# Patient Record
Sex: Female | Born: 1960 | Race: White | Hispanic: No | Marital: Single | Smoking: Never smoker
Health system: Southern US, Community
[De-identification: ages and names within clinical notes are randomized; demographics above are authoritative.]

## PROBLEM LIST (undated history)

## (undated) DIAGNOSIS — F840 Autistic disorder: Secondary | ICD-10-CM

## (undated) DIAGNOSIS — S32009A Unspecified fracture of unspecified lumbar vertebra, initial encounter for closed fracture: Secondary | ICD-10-CM

## (undated) DIAGNOSIS — M797 Fibromyalgia: Secondary | ICD-10-CM

## (undated) HISTORY — DX: Fibromyalgia: M79.7

## (undated) HISTORY — PX: EXTERNAL EAR SURGERY: SHX627

---

## 2011-10-24 DIAGNOSIS — J069 Acute upper respiratory infection, unspecified: Secondary | ICD-10-CM | POA: Diagnosis not present

## 2011-10-24 DIAGNOSIS — B9789 Other viral agents as the cause of diseases classified elsewhere: Secondary | ICD-10-CM | POA: Diagnosis not present

## 2011-10-24 DIAGNOSIS — Z79899 Other long term (current) drug therapy: Secondary | ICD-10-CM | POA: Diagnosis not present

## 2011-12-30 DIAGNOSIS — E782 Mixed hyperlipidemia: Secondary | ICD-10-CM | POA: Diagnosis not present

## 2011-12-30 DIAGNOSIS — M545 Low back pain: Secondary | ICD-10-CM | POA: Diagnosis not present

## 2011-12-30 DIAGNOSIS — K219 Gastro-esophageal reflux disease without esophagitis: Secondary | ICD-10-CM | POA: Diagnosis not present

## 2011-12-30 DIAGNOSIS — M549 Dorsalgia, unspecified: Secondary | ICD-10-CM | POA: Diagnosis not present

## 2011-12-30 DIAGNOSIS — F411 Generalized anxiety disorder: Secondary | ICD-10-CM | POA: Diagnosis not present

## 2012-03-31 DIAGNOSIS — E782 Mixed hyperlipidemia: Secondary | ICD-10-CM | POA: Diagnosis not present

## 2012-03-31 DIAGNOSIS — M549 Dorsalgia, unspecified: Secondary | ICD-10-CM | POA: Diagnosis not present

## 2012-06-27 DIAGNOSIS — Z23 Encounter for immunization: Secondary | ICD-10-CM | POA: Diagnosis not present

## 2012-07-17 DIAGNOSIS — M549 Dorsalgia, unspecified: Secondary | ICD-10-CM | POA: Diagnosis not present

## 2012-10-19 DIAGNOSIS — M545 Low back pain: Secondary | ICD-10-CM | POA: Diagnosis not present

## 2012-12-04 DIAGNOSIS — M47817 Spondylosis without myelopathy or radiculopathy, lumbosacral region: Secondary | ICD-10-CM | POA: Diagnosis not present

## 2012-12-04 DIAGNOSIS — M51379 Other intervertebral disc degeneration, lumbosacral region without mention of lumbar back pain or lower extremity pain: Secondary | ICD-10-CM | POA: Diagnosis not present

## 2012-12-04 DIAGNOSIS — M5137 Other intervertebral disc degeneration, lumbosacral region: Secondary | ICD-10-CM | POA: Diagnosis not present

## 2013-01-12 DIAGNOSIS — F410 Panic disorder [episodic paroxysmal anxiety] without agoraphobia: Secondary | ICD-10-CM | POA: Diagnosis not present

## 2013-01-12 DIAGNOSIS — F259 Schizoaffective disorder, unspecified: Secondary | ICD-10-CM | POA: Diagnosis not present

## 2013-01-18 DIAGNOSIS — M549 Dorsalgia, unspecified: Secondary | ICD-10-CM | POA: Diagnosis not present

## 2013-04-19 DIAGNOSIS — M549 Dorsalgia, unspecified: Secondary | ICD-10-CM | POA: Diagnosis not present

## 2013-05-15 DIAGNOSIS — F410 Panic disorder [episodic paroxysmal anxiety] without agoraphobia: Secondary | ICD-10-CM | POA: Diagnosis not present

## 2013-05-15 DIAGNOSIS — F4001 Agoraphobia with panic disorder: Secondary | ICD-10-CM | POA: Diagnosis not present

## 2013-05-15 DIAGNOSIS — F209 Schizophrenia, unspecified: Secondary | ICD-10-CM | POA: Diagnosis not present

## 2013-05-15 DIAGNOSIS — F4542 Pain disorder with related psychological factors: Secondary | ICD-10-CM | POA: Diagnosis not present

## 2013-05-15 DIAGNOSIS — Z79899 Other long term (current) drug therapy: Secondary | ICD-10-CM | POA: Diagnosis not present

## 2013-06-11 DIAGNOSIS — S32009A Unspecified fracture of unspecified lumbar vertebra, initial encounter for closed fracture: Secondary | ICD-10-CM

## 2013-06-11 HISTORY — DX: Unspecified fracture of unspecified lumbar vertebra, initial encounter for closed fracture: S32.009A

## 2013-07-10 DIAGNOSIS — F411 Generalized anxiety disorder: Secondary | ICD-10-CM | POA: Diagnosis not present

## 2013-07-10 DIAGNOSIS — F329 Major depressive disorder, single episode, unspecified: Secondary | ICD-10-CM | POA: Diagnosis not present

## 2013-07-10 DIAGNOSIS — R51 Headache: Secondary | ICD-10-CM | POA: Diagnosis not present

## 2013-07-10 DIAGNOSIS — Z79899 Other long term (current) drug therapy: Secondary | ICD-10-CM | POA: Diagnosis not present

## 2013-07-10 DIAGNOSIS — S0990XA Unspecified injury of head, initial encounter: Secondary | ICD-10-CM | POA: Diagnosis not present

## 2013-07-10 DIAGNOSIS — S40029A Contusion of unspecified upper arm, initial encounter: Secondary | ICD-10-CM | POA: Diagnosis not present

## 2013-07-10 DIAGNOSIS — T148XXA Other injury of unspecified body region, initial encounter: Secondary | ICD-10-CM | POA: Diagnosis not present

## 2013-07-10 DIAGNOSIS — S0993XA Unspecified injury of face, initial encounter: Secondary | ICD-10-CM | POA: Diagnosis not present

## 2013-07-10 DIAGNOSIS — R109 Unspecified abdominal pain: Secondary | ICD-10-CM | POA: Diagnosis not present

## 2013-07-10 DIAGNOSIS — M542 Cervicalgia: Secondary | ICD-10-CM | POA: Diagnosis not present

## 2013-07-10 DIAGNOSIS — F840 Autistic disorder: Secondary | ICD-10-CM | POA: Diagnosis not present

## 2013-07-10 DIAGNOSIS — R52 Pain, unspecified: Secondary | ICD-10-CM | POA: Diagnosis not present

## 2013-07-12 ENCOUNTER — Emergency Department (HOSPITAL_COMMUNITY)
Admission: EM | Admit: 2013-07-12 | Discharge: 2013-07-12 | Disposition: A | Discharge status: Home / Self Care | Payer: No Typology Code available for payment source | Attending: Emergency Medicine | Admitting: Emergency Medicine

## 2013-07-12 ENCOUNTER — Encounter (HOSPITAL_COMMUNITY): Payer: Self-pay | Admitting: *Deleted

## 2013-07-12 ENCOUNTER — Emergency Department (HOSPITAL_COMMUNITY): Payer: No Typology Code available for payment source

## 2013-07-12 DIAGNOSIS — Y9389 Activity, other specified: Secondary | ICD-10-CM | POA: Diagnosis not present

## 2013-07-12 DIAGNOSIS — S0993XA Unspecified injury of face, initial encounter: Secondary | ICD-10-CM | POA: Insufficient documentation

## 2013-07-12 DIAGNOSIS — M542 Cervicalgia: Secondary | ICD-10-CM | POA: Diagnosis not present

## 2013-07-12 DIAGNOSIS — T07XXXA Unspecified multiple injuries, initial encounter: Secondary | ICD-10-CM | POA: Diagnosis not present

## 2013-07-12 DIAGNOSIS — S8990XA Unspecified injury of unspecified lower leg, initial encounter: Secondary | ICD-10-CM | POA: Diagnosis not present

## 2013-07-12 DIAGNOSIS — Y92410 Unspecified street and highway as the place of occurrence of the external cause: Secondary | ICD-10-CM | POA: Insufficient documentation

## 2013-07-12 DIAGNOSIS — S0990XA Unspecified injury of head, initial encounter: Secondary | ICD-10-CM | POA: Diagnosis not present

## 2013-07-12 DIAGNOSIS — R079 Chest pain, unspecified: Secondary | ICD-10-CM | POA: Diagnosis not present

## 2013-07-12 DIAGNOSIS — S300XXA Contusion of lower back and pelvis, initial encounter: Secondary | ICD-10-CM | POA: Diagnosis not present

## 2013-07-12 DIAGNOSIS — S8000XA Contusion of unspecified knee, initial encounter: Secondary | ICD-10-CM | POA: Insufficient documentation

## 2013-07-12 DIAGNOSIS — Z8659 Personal history of other mental and behavioral disorders: Secondary | ICD-10-CM | POA: Insufficient documentation

## 2013-07-12 DIAGNOSIS — S3981XA Other specified injuries of abdomen, initial encounter: Secondary | ICD-10-CM | POA: Insufficient documentation

## 2013-07-12 DIAGNOSIS — M25569 Pain in unspecified knee: Secondary | ICD-10-CM | POA: Diagnosis not present

## 2013-07-12 DIAGNOSIS — S32009A Unspecified fracture of unspecified lumbar vertebra, initial encounter for closed fracture: Secondary | ICD-10-CM | POA: Insufficient documentation

## 2013-07-12 DIAGNOSIS — S4980XA Other specified injuries of shoulder and upper arm, unspecified arm, initial encounter: Secondary | ICD-10-CM | POA: Diagnosis not present

## 2013-07-12 DIAGNOSIS — R0602 Shortness of breath: Secondary | ICD-10-CM | POA: Diagnosis not present

## 2013-07-12 DIAGNOSIS — Z79899 Other long term (current) drug therapy: Secondary | ICD-10-CM | POA: Insufficient documentation

## 2013-07-12 DIAGNOSIS — S46909A Unspecified injury of unspecified muscle, fascia and tendon at shoulder and upper arm level, unspecified arm, initial encounter: Secondary | ICD-10-CM | POA: Diagnosis not present

## 2013-07-12 DIAGNOSIS — S40029A Contusion of unspecified upper arm, initial encounter: Secondary | ICD-10-CM | POA: Insufficient documentation

## 2013-07-12 DIAGNOSIS — R109 Unspecified abdominal pain: Secondary | ICD-10-CM | POA: Diagnosis not present

## 2013-07-12 DIAGNOSIS — S298XXA Other specified injuries of thorax, initial encounter: Secondary | ICD-10-CM | POA: Diagnosis not present

## 2013-07-12 DIAGNOSIS — M79609 Pain in unspecified limb: Secondary | ICD-10-CM | POA: Diagnosis not present

## 2013-07-12 DIAGNOSIS — IMO0002 Reserved for concepts with insufficient information to code with codable children: Secondary | ICD-10-CM | POA: Diagnosis present

## 2013-07-12 HISTORY — DX: Autistic disorder: F84.0

## 2013-07-12 LAB — COMPREHENSIVE METABOLIC PANEL
ALT: 19 U/L (ref 0–35)
AST: 27 U/L (ref 0–37)
Albumin: 3.7 g/dL (ref 3.5–5.2)
BUN: 13 mg/dL (ref 6–23)
CO2: 29 mEq/L (ref 19–32)
Chloride: 98 mEq/L (ref 96–112)
Creatinine, Ser: 0.82 mg/dL (ref 0.50–1.10)
GFR calc Af Amer: >90 mL/min (ref >90)
Glucose, Bld: 104 mg/dL — ABNORMAL HIGH (ref 70–99)
Potassium: 3.8 mEq/L (ref 3.5–5.1)

## 2013-07-12 LAB — CBC WITH DIFFERENTIAL/PLATELET
Basophils Absolute: 0.1 10*3/uL (ref 0.0–0.1)
Basophils Relative: 1 % (ref 0–1)
HCT: 39.4 % (ref 36.0–46.0)
Hemoglobin: 13.7 g/dL (ref 12.0–15.0)
Lymphocytes Relative: 30 % (ref 12–46)
Lymphs Abs: 1.8 10*3/uL (ref 0.7–4.0)
MCH: 31.1 pg (ref 26.0–34.0)
MCHC: 34.8 g/dL (ref 30.0–36.0)
Monocytes Absolute: 0.4 10*3/uL (ref 0.1–1.0)
Monocytes Relative: 7 % (ref 3–12)
Neutro Abs: 3.6 10*3/uL (ref 1.7–7.7)
Neutrophils Relative %: 60 % (ref 43–77)
Platelets: 189 10*3/uL (ref 150–400)
RBC: 4.4 MIL/uL (ref 3.87–5.11)

## 2013-07-12 LAB — URINALYSIS, ROUTINE W REFLEX MICROSCOPIC
Bilirubin Urine: NEGATIVE
Glucose, UA: NEGATIVE mg/dL
Hgb urine dipstick: NEGATIVE
Nitrite: NEGATIVE
Specific Gravity, Urine: 1.01 (ref 1.005–1.030)
pH: 6 (ref 5.0–8.0)

## 2013-07-12 LAB — URINE MICROSCOPIC-ADD ON

## 2013-07-12 MED ORDER — HYDROCODONE-ACETAMINOPHEN 5-325 MG PO TABS
2.0000 | ORAL_TABLET | Freq: Once | ORAL | Status: AC
  Administered 2013-07-12: 2 via ORAL
  Filled 2013-07-12: qty 2

## 2013-07-12 MED ORDER — HYDROCODONE-ACETAMINOPHEN 5-325 MG PO TABS: 2.0000 | ORAL_TABLET | ORAL | Status: DC | PRN

## 2013-07-12 MED ORDER — IOHEXOL 300 MG/ML  SOLN
100.0000 mL | Freq: Once | INTRAMUSCULAR | Status: AC | PRN
  Administered 2013-07-12: 100 mL via INTRAVENOUS
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
## 2013-07-12 NOTE — ED Notes (Signed)
Pt signed consent for [facility] records to be sent over via fax on her behalf.

## 2013-07-12 NOTE — ED Notes (Signed)
ED secretary reported that no records have been recieved at this time.

## 2013-07-20 DIAGNOSIS — Z23 Encounter for immunization: Secondary | ICD-10-CM | POA: Diagnosis not present

## 2013-08-03 DIAGNOSIS — M542 Cervicalgia: Secondary | ICD-10-CM | POA: Diagnosis not present

## 2013-08-03 DIAGNOSIS — M4040 Postural lordosis, site unspecified: Secondary | ICD-10-CM | POA: Diagnosis not present

## 2013-08-03 DIAGNOSIS — M549 Dorsalgia, unspecified: Secondary | ICD-10-CM | POA: Diagnosis not present

## 2013-08-03 DIAGNOSIS — S0993XA Unspecified injury of face, initial encounter: Secondary | ICD-10-CM | POA: Diagnosis not present

## 2013-09-10 DIAGNOSIS — F4310 Post-traumatic stress disorder, unspecified: Secondary | ICD-10-CM | POA: Diagnosis not present

## 2013-09-12 ENCOUNTER — Encounter (HOSPITAL_COMMUNITY): Payer: Self-pay | Admitting: Emergency Medicine

## 2013-09-12 ENCOUNTER — Emergency Department (HOSPITAL_COMMUNITY)
Admission: EM | Admit: 2013-09-12 | Discharge: 2013-09-12 | Disposition: A | Discharge status: Home / Self Care | Payer: No Typology Code available for payment source | Attending: Emergency Medicine | Admitting: Emergency Medicine

## 2013-09-12 DIAGNOSIS — Z8659 Personal history of other mental and behavioral disorders: Secondary | ICD-10-CM | POA: Insufficient documentation

## 2013-09-12 DIAGNOSIS — Z79899 Other long term (current) drug therapy: Secondary | ICD-10-CM | POA: Diagnosis not present

## 2013-09-12 DIAGNOSIS — Z8781 Personal history of (healed) traumatic fracture: Secondary | ICD-10-CM | POA: Insufficient documentation

## 2013-09-12 DIAGNOSIS — M549 Dorsalgia, unspecified: Secondary | ICD-10-CM

## 2013-09-12 DIAGNOSIS — M545 Low back pain, unspecified: Secondary | ICD-10-CM | POA: Insufficient documentation

## 2013-09-12 DIAGNOSIS — R51 Headache: Secondary | ICD-10-CM | POA: Insufficient documentation

## 2013-09-12 DIAGNOSIS — G8911 Acute pain due to trauma: Secondary | ICD-10-CM | POA: Insufficient documentation

## 2013-09-12 HISTORY — DX: Unspecified fracture of unspecified lumbar vertebra, initial encounter for closed fracture: S32.009A
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
## 2013-09-12 NOTE — ED Notes (Signed)
Pt seen and evaluated by EDPa for initial assessment. 
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
## 2013-09-14 NOTE — ED Provider Notes (Signed)
Medical screening examination/treatment/procedure(s) were performed by non-physician practitioner and as supervising physician I was immediately available for consultation/collaboration.  EKG Interpretation   None         Shelda Jakes, MD 09/14/13 1640

## 2013-11-16 DIAGNOSIS — M79609 Pain in unspecified limb: Secondary | ICD-10-CM | POA: Diagnosis not present

## 2013-11-16 DIAGNOSIS — M25519 Pain in unspecified shoulder: Secondary | ICD-10-CM | POA: Diagnosis not present

## 2014-01-03 DIAGNOSIS — E782 Mixed hyperlipidemia: Secondary | ICD-10-CM | POA: Diagnosis not present

## 2014-01-03 DIAGNOSIS — Z0000 Encounter for general adult medical examination without abnormal findings: Secondary | ICD-10-CM | POA: Diagnosis not present

## 2014-01-08 DIAGNOSIS — F411 Generalized anxiety disorder: Secondary | ICD-10-CM | POA: Diagnosis not present

## 2014-01-08 DIAGNOSIS — F259 Schizoaffective disorder, unspecified: Secondary | ICD-10-CM | POA: Diagnosis not present

## 2014-03-21 DIAGNOSIS — M549 Dorsalgia, unspecified: Secondary | ICD-10-CM | POA: Diagnosis not present

## 2014-05-21 DIAGNOSIS — M549 Dorsalgia, unspecified: Secondary | ICD-10-CM | POA: Diagnosis not present

## 2014-07-18 DIAGNOSIS — Z23 Encounter for immunization: Secondary | ICD-10-CM | POA: Diagnosis not present

## 2014-07-23 DIAGNOSIS — M545 Low back pain: Secondary | ICD-10-CM | POA: Diagnosis not present

## 2014-07-23 DIAGNOSIS — Z131 Encounter for screening for diabetes mellitus: Secondary | ICD-10-CM | POA: Diagnosis not present

## 2014-07-23 DIAGNOSIS — Z136 Encounter for screening for cardiovascular disorders: Secondary | ICD-10-CM | POA: Diagnosis not present

## 2014-07-30 DIAGNOSIS — F4310 Post-traumatic stress disorder, unspecified: Secondary | ICD-10-CM | POA: Diagnosis not present

## 2014-07-30 DIAGNOSIS — Z79899 Other long term (current) drug therapy: Secondary | ICD-10-CM | POA: Diagnosis not present

## 2014-07-30 DIAGNOSIS — F259 Schizoaffective disorder, unspecified: Secondary | ICD-10-CM | POA: Diagnosis not present

## 2014-10-24 DIAGNOSIS — J018 Other acute sinusitis: Secondary | ICD-10-CM | POA: Diagnosis not present

## 2014-10-28 DIAGNOSIS — F419 Anxiety disorder, unspecified: Secondary | ICD-10-CM | POA: Diagnosis not present

## 2014-10-28 DIAGNOSIS — F259 Schizoaffective disorder, unspecified: Secondary | ICD-10-CM | POA: Diagnosis not present
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
## 2015-01-22 DIAGNOSIS — E785 Hyperlipidemia, unspecified: Secondary | ICD-10-CM | POA: Diagnosis not present

## 2015-01-22 DIAGNOSIS — Z131 Encounter for screening for diabetes mellitus: Secondary | ICD-10-CM | POA: Diagnosis not present

## 2015-01-22 DIAGNOSIS — K219 Gastro-esophageal reflux disease without esophagitis: Secondary | ICD-10-CM | POA: Diagnosis not present

## 2015-01-22 DIAGNOSIS — Z1389 Encounter for screening for other disorder: Secondary | ICD-10-CM | POA: Diagnosis not present

## 2015-01-22 DIAGNOSIS — Z0000 Encounter for general adult medical examination without abnormal findings: Secondary | ICD-10-CM | POA: Diagnosis not present

## 2015-01-30 DIAGNOSIS — Z1231 Encounter for screening mammogram for malignant neoplasm of breast: Secondary | ICD-10-CM | POA: Diagnosis not present

## 2015-02-25 DIAGNOSIS — Z79899 Other long term (current) drug therapy: Secondary | ICD-10-CM | POA: Diagnosis not present

## 2015-02-25 DIAGNOSIS — F411 Generalized anxiety disorder: Secondary | ICD-10-CM | POA: Diagnosis not present

## 2015-02-25 DIAGNOSIS — F259 Schizoaffective disorder, unspecified: Secondary | ICD-10-CM | POA: Diagnosis not present

## 2015-03-24 DIAGNOSIS — M545 Low back pain: Secondary | ICD-10-CM | POA: Diagnosis not present

## 2015-06-19 DIAGNOSIS — K219 Gastro-esophageal reflux disease without esophagitis: Secondary | ICD-10-CM | POA: Diagnosis not present

## 2015-06-19 DIAGNOSIS — Z1211 Encounter for screening for malignant neoplasm of colon: Secondary | ICD-10-CM | POA: Diagnosis not present

## 2015-06-19 DIAGNOSIS — F209 Schizophrenia, unspecified: Secondary | ICD-10-CM | POA: Diagnosis not present

## 2015-06-19 DIAGNOSIS — E780 Pure hypercholesterolemia: Secondary | ICD-10-CM | POA: Diagnosis not present

## 2015-06-19 DIAGNOSIS — Z79899 Other long term (current) drug therapy: Secondary | ICD-10-CM | POA: Diagnosis not present

## 2015-06-19 DIAGNOSIS — Q438 Other specified congenital malformations of intestine: Secondary | ICD-10-CM | POA: Diagnosis not present

## 2015-06-23 DIAGNOSIS — K210 Gastro-esophageal reflux disease with esophagitis: Secondary | ICD-10-CM | POA: Diagnosis not present

## 2015-06-23 DIAGNOSIS — Z23 Encounter for immunization: Secondary | ICD-10-CM | POA: Diagnosis not present

## 2015-06-23 DIAGNOSIS — E785 Hyperlipidemia, unspecified: Secondary | ICD-10-CM | POA: Diagnosis not present

## 2015-06-27 DIAGNOSIS — F410 Panic disorder [episodic paroxysmal anxiety] without agoraphobia: Secondary | ICD-10-CM | POA: Diagnosis not present

## 2015-06-27 DIAGNOSIS — F250 Schizoaffective disorder, bipolar type: Secondary | ICD-10-CM | POA: Diagnosis not present

## 2015-07-04 DIAGNOSIS — F410 Panic disorder [episodic paroxysmal anxiety] without agoraphobia: Secondary | ICD-10-CM | POA: Diagnosis not present

## 2015-09-22 DIAGNOSIS — M62830 Muscle spasm of back: Secondary | ICD-10-CM | POA: Diagnosis not present

## 2015-09-22 DIAGNOSIS — K210 Gastro-esophageal reflux disease with esophagitis: Secondary | ICD-10-CM | POA: Diagnosis not present

## 2015-12-22 DIAGNOSIS — Z13220 Encounter for screening for lipoid disorders: Secondary | ICD-10-CM | POA: Diagnosis not present

## 2015-12-22 DIAGNOSIS — M62830 Muscle spasm of back: Secondary | ICD-10-CM | POA: Diagnosis not present

## 2015-12-22 DIAGNOSIS — K210 Gastro-esophageal reflux disease with esophagitis: Secondary | ICD-10-CM | POA: Diagnosis not present

## 2016-02-04 DIAGNOSIS — Z5181 Encounter for therapeutic drug level monitoring: Secondary | ICD-10-CM | POA: Diagnosis not present

## 2016-02-04 DIAGNOSIS — M545 Low back pain: Secondary | ICD-10-CM | POA: Diagnosis not present

## 2016-02-20 DIAGNOSIS — F330 Major depressive disorder, recurrent, mild: Secondary | ICD-10-CM | POA: Diagnosis not present

## 2016-02-20 DIAGNOSIS — Z79899 Other long term (current) drug therapy: Secondary | ICD-10-CM | POA: Diagnosis not present

## 2016-03-01 DIAGNOSIS — Z79899 Other long term (current) drug therapy: Secondary | ICD-10-CM | POA: Diagnosis not present

## 2016-03-01 DIAGNOSIS — F330 Major depressive disorder, recurrent, mild: Secondary | ICD-10-CM | POA: Diagnosis not present

## 2016-03-23 DIAGNOSIS — M25511 Pain in right shoulder: Secondary | ICD-10-CM | POA: Diagnosis not present

## 2016-03-23 DIAGNOSIS — Z0000 Encounter for general adult medical examination without abnormal findings: Secondary | ICD-10-CM | POA: Diagnosis not present

## 2016-03-23 DIAGNOSIS — K219 Gastro-esophageal reflux disease without esophagitis: Secondary | ICD-10-CM | POA: Diagnosis not present

## 2016-03-23 DIAGNOSIS — M545 Low back pain: Secondary | ICD-10-CM | POA: Diagnosis not present

## 2016-03-23 DIAGNOSIS — Z1389 Encounter for screening for other disorder: Secondary | ICD-10-CM | POA: Diagnosis not present

## 2016-04-29 DIAGNOSIS — Z6826 Body mass index (BMI) 26.0-26.9, adult: Secondary | ICD-10-CM | POA: Diagnosis not present

## 2016-04-29 DIAGNOSIS — Z01419 Encounter for gynecological examination (general) (routine) without abnormal findings: Secondary | ICD-10-CM | POA: Diagnosis not present

## 2016-05-25 DIAGNOSIS — Z79899 Other long term (current) drug therapy: Secondary | ICD-10-CM | POA: Diagnosis not present

## 2016-05-25 DIAGNOSIS — F250 Schizoaffective disorder, bipolar type: Secondary | ICD-10-CM | POA: Diagnosis not present

## 2016-06-21 DIAGNOSIS — K219 Gastro-esophageal reflux disease without esophagitis: Secondary | ICD-10-CM | POA: Diagnosis not present

## 2016-06-21 DIAGNOSIS — M62830 Muscle spasm of back: Secondary | ICD-10-CM | POA: Diagnosis not present

## 2016-06-28 DIAGNOSIS — M545 Low back pain: Secondary | ICD-10-CM | POA: Diagnosis not present

## 2016-07-01 DIAGNOSIS — M545 Low back pain: Secondary | ICD-10-CM | POA: Diagnosis not present

## 2016-07-05 DIAGNOSIS — M545 Low back pain: Secondary | ICD-10-CM | POA: Diagnosis not present

## 2016-07-08 DIAGNOSIS — Z23 Encounter for immunization: Secondary | ICD-10-CM | POA: Diagnosis not present

## 2016-07-08 DIAGNOSIS — M545 Low back pain: Secondary | ICD-10-CM | POA: Diagnosis not present

## 2016-07-15 DIAGNOSIS — M545 Low back pain: Secondary | ICD-10-CM | POA: Diagnosis not present

## 2016-07-19 DIAGNOSIS — M545 Low back pain: Secondary | ICD-10-CM | POA: Diagnosis not present

## 2016-07-22 DIAGNOSIS — M545 Low back pain: Secondary | ICD-10-CM | POA: Diagnosis not present

## 2016-07-26 DIAGNOSIS — M545 Low back pain: Secondary | ICD-10-CM | POA: Diagnosis not present

## 2016-07-29 DIAGNOSIS — M47816 Spondylosis without myelopathy or radiculopathy, lumbar region: Secondary | ICD-10-CM | POA: Diagnosis not present

## 2016-07-29 DIAGNOSIS — G8929 Other chronic pain: Secondary | ICD-10-CM | POA: Diagnosis not present

## 2016-07-29 DIAGNOSIS — Z79899 Other long term (current) drug therapy: Secondary | ICD-10-CM | POA: Diagnosis not present

## 2016-08-12 DIAGNOSIS — F79 Unspecified intellectual disabilities: Secondary | ICD-10-CM | POA: Diagnosis not present

## 2016-08-12 DIAGNOSIS — M47816 Spondylosis without myelopathy or radiculopathy, lumbar region: Secondary | ICD-10-CM | POA: Diagnosis not present

## 2016-08-12 DIAGNOSIS — G8929 Other chronic pain: Secondary | ICD-10-CM | POA: Diagnosis not present

## 2016-08-17 DIAGNOSIS — Z79899 Other long term (current) drug therapy: Secondary | ICD-10-CM | POA: Diagnosis not present

## 2016-08-17 DIAGNOSIS — F250 Schizoaffective disorder, bipolar type: Secondary | ICD-10-CM | POA: Diagnosis not present

## 2016-08-23 DIAGNOSIS — Z79899 Other long term (current) drug therapy: Secondary | ICD-10-CM | POA: Diagnosis not present

## 2016-08-23 DIAGNOSIS — E782 Mixed hyperlipidemia: Secondary | ICD-10-CM | POA: Diagnosis not present

## 2016-08-23 DIAGNOSIS — K219 Gastro-esophageal reflux disease without esophagitis: Secondary | ICD-10-CM | POA: Diagnosis not present

## 2016-08-23 DIAGNOSIS — E784 Other hyperlipidemia: Secondary | ICD-10-CM | POA: Diagnosis not present

## 2016-09-21 DIAGNOSIS — M47816 Spondylosis without myelopathy or radiculopathy, lumbar region: Secondary | ICD-10-CM | POA: Diagnosis not present

## 2016-09-21 DIAGNOSIS — Z79891 Long term (current) use of opiate analgesic: Secondary | ICD-10-CM | POA: Diagnosis not present

## 2016-09-21 DIAGNOSIS — G8929 Other chronic pain: Secondary | ICD-10-CM | POA: Diagnosis not present

## 2016-09-21 DIAGNOSIS — M5136 Other intervertebral disc degeneration, lumbar region: Secondary | ICD-10-CM | POA: Diagnosis not present

## 2016-10-20 DIAGNOSIS — M47816 Spondylosis without myelopathy or radiculopathy, lumbar region: Secondary | ICD-10-CM | POA: Diagnosis not present

## 2016-10-20 DIAGNOSIS — Z79891 Long term (current) use of opiate analgesic: Secondary | ICD-10-CM | POA: Diagnosis not present

## 2016-10-20 DIAGNOSIS — G8929 Other chronic pain: Secondary | ICD-10-CM | POA: Diagnosis not present

## 2016-10-20 DIAGNOSIS — M5136 Other intervertebral disc degeneration, lumbar region: Secondary | ICD-10-CM | POA: Diagnosis not present

## 2016-11-08 DIAGNOSIS — E784 Other hyperlipidemia: Secondary | ICD-10-CM | POA: Diagnosis not present

## 2016-11-08 DIAGNOSIS — K219 Gastro-esophageal reflux disease without esophagitis: Secondary | ICD-10-CM | POA: Diagnosis not present

## 2016-11-19 DIAGNOSIS — F411 Generalized anxiety disorder: Secondary | ICD-10-CM | POA: Diagnosis not present

## 2016-12-14 DIAGNOSIS — Z79891 Long term (current) use of opiate analgesic: Secondary | ICD-10-CM | POA: Diagnosis not present

## 2017-01-06 DIAGNOSIS — K219 Gastro-esophageal reflux disease without esophagitis: Secondary | ICD-10-CM | POA: Diagnosis not present

## 2017-01-06 DIAGNOSIS — Z79899 Other long term (current) drug therapy: Secondary | ICD-10-CM | POA: Diagnosis not present

## 2017-01-06 DIAGNOSIS — E784 Other hyperlipidemia: Secondary | ICD-10-CM | POA: Diagnosis not present

## 2017-01-12 DIAGNOSIS — F79 Unspecified intellectual disabilities: Secondary | ICD-10-CM | POA: Diagnosis not present

## 2017-01-12 DIAGNOSIS — M47816 Spondylosis without myelopathy or radiculopathy, lumbar region: Secondary | ICD-10-CM | POA: Diagnosis not present

## 2017-01-12 DIAGNOSIS — G8929 Other chronic pain: Secondary | ICD-10-CM | POA: Diagnosis not present

## 2017-03-09 DIAGNOSIS — Z79899 Other long term (current) drug therapy: Secondary | ICD-10-CM | POA: Diagnosis not present

## 2017-03-09 DIAGNOSIS — F250 Schizoaffective disorder, bipolar type: Secondary | ICD-10-CM | POA: Diagnosis not present

## 2017-03-11 DIAGNOSIS — F330 Major depressive disorder, recurrent, mild: Secondary | ICD-10-CM | POA: Diagnosis not present

## 2017-03-11 DIAGNOSIS — F410 Panic disorder [episodic paroxysmal anxiety] without agoraphobia: Secondary | ICD-10-CM | POA: Diagnosis not present

## 2017-03-11 DIAGNOSIS — Z79899 Other long term (current) drug therapy: Secondary | ICD-10-CM | POA: Diagnosis not present

## 2017-04-08 DIAGNOSIS — E784 Other hyperlipidemia: Secondary | ICD-10-CM | POA: Diagnosis not present

## 2017-04-08 DIAGNOSIS — K219 Gastro-esophageal reflux disease without esophagitis: Secondary | ICD-10-CM | POA: Diagnosis not present

## 2017-05-11 DIAGNOSIS — G8929 Other chronic pain: Secondary | ICD-10-CM | POA: Diagnosis not present

## 2017-05-11 DIAGNOSIS — Z79899 Other long term (current) drug therapy: Secondary | ICD-10-CM | POA: Diagnosis not present

## 2017-05-11 DIAGNOSIS — M545 Low back pain: Secondary | ICD-10-CM | POA: Diagnosis not present

## 2017-05-18 DIAGNOSIS — Z79899 Other long term (current) drug therapy: Secondary | ICD-10-CM | POA: Diagnosis not present

## 2017-05-18 DIAGNOSIS — F410 Panic disorder [episodic paroxysmal anxiety] without agoraphobia: Secondary | ICD-10-CM | POA: Diagnosis not present

## 2017-06-14 DIAGNOSIS — M545 Low back pain: Secondary | ICD-10-CM | POA: Diagnosis not present

## 2017-06-14 DIAGNOSIS — Z79899 Other long term (current) drug therapy: Secondary | ICD-10-CM | POA: Diagnosis not present

## 2017-06-14 DIAGNOSIS — G8929 Other chronic pain: Secondary | ICD-10-CM | POA: Diagnosis not present

## 2017-06-14 DIAGNOSIS — M5136 Other intervertebral disc degeneration, lumbar region: Secondary | ICD-10-CM | POA: Diagnosis not present

## 2017-06-14 DIAGNOSIS — M797 Fibromyalgia: Secondary | ICD-10-CM | POA: Diagnosis not present

## 2017-07-12 DIAGNOSIS — M5136 Other intervertebral disc degeneration, lumbar region: Secondary | ICD-10-CM | POA: Diagnosis not present

## 2017-07-12 DIAGNOSIS — M797 Fibromyalgia: Secondary | ICD-10-CM | POA: Diagnosis not present

## 2017-07-12 DIAGNOSIS — Z79899 Other long term (current) drug therapy: Secondary | ICD-10-CM | POA: Diagnosis not present

## 2017-07-13 DIAGNOSIS — Z23 Encounter for immunization: Secondary | ICD-10-CM | POA: Diagnosis not present

## 2017-07-14 DIAGNOSIS — K219 Gastro-esophageal reflux disease without esophagitis: Secondary | ICD-10-CM | POA: Diagnosis not present

## 2017-07-14 DIAGNOSIS — E782 Mixed hyperlipidemia: Secondary | ICD-10-CM | POA: Diagnosis not present

## 2017-07-14 DIAGNOSIS — E7849 Other hyperlipidemia: Secondary | ICD-10-CM | POA: Diagnosis not present

## 2017-07-29 DIAGNOSIS — Z79899 Other long term (current) drug therapy: Secondary | ICD-10-CM | POA: Diagnosis not present

## 2017-07-29 DIAGNOSIS — F250 Schizoaffective disorder, bipolar type: Secondary | ICD-10-CM | POA: Diagnosis not present

## 2017-08-11 DIAGNOSIS — Z79899 Other long term (current) drug therapy: Secondary | ICD-10-CM | POA: Diagnosis not present

## 2017-08-11 DIAGNOSIS — M5136 Other intervertebral disc degeneration, lumbar region: Secondary | ICD-10-CM | POA: Diagnosis not present

## 2017-08-11 DIAGNOSIS — M797 Fibromyalgia: Secondary | ICD-10-CM | POA: Diagnosis not present

## 2017-08-19 DIAGNOSIS — F411 Generalized anxiety disorder: Secondary | ICD-10-CM | POA: Diagnosis not present

## 2017-08-19 DIAGNOSIS — Z79899 Other long term (current) drug therapy: Secondary | ICD-10-CM | POA: Diagnosis not present

## 2017-09-06 DIAGNOSIS — F411 Generalized anxiety disorder: Secondary | ICD-10-CM | POA: Diagnosis not present

## 2017-09-06 DIAGNOSIS — Z79899 Other long term (current) drug therapy: Secondary | ICD-10-CM | POA: Diagnosis not present

## 2017-09-12 DIAGNOSIS — M5136 Other intervertebral disc degeneration, lumbar region: Secondary | ICD-10-CM | POA: Diagnosis not present

## 2017-09-12 DIAGNOSIS — M545 Low back pain: Secondary | ICD-10-CM | POA: Diagnosis not present

## 2017-09-12 DIAGNOSIS — Z79899 Other long term (current) drug therapy: Secondary | ICD-10-CM | POA: Diagnosis not present

## 2017-09-15 DIAGNOSIS — F411 Generalized anxiety disorder: Secondary | ICD-10-CM | POA: Diagnosis not present

## 2017-09-15 DIAGNOSIS — Z79899 Other long term (current) drug therapy: Secondary | ICD-10-CM | POA: Diagnosis not present

## 2017-09-29 DIAGNOSIS — F200 Paranoid schizophrenia: Secondary | ICD-10-CM | POA: Diagnosis not present

## 2017-09-29 DIAGNOSIS — F411 Generalized anxiety disorder: Secondary | ICD-10-CM | POA: Diagnosis not present

## 2017-09-29 DIAGNOSIS — Z79899 Other long term (current) drug therapy: Secondary | ICD-10-CM | POA: Diagnosis not present

## 2017-10-06 DIAGNOSIS — M797 Fibromyalgia: Secondary | ICD-10-CM | POA: Diagnosis not present

## 2017-10-06 DIAGNOSIS — Z79899 Other long term (current) drug therapy: Secondary | ICD-10-CM | POA: Diagnosis not present

## 2017-10-06 DIAGNOSIS — M5136 Other intervertebral disc degeneration, lumbar region: Secondary | ICD-10-CM | POA: Diagnosis not present

## 2017-10-10 DIAGNOSIS — Z79899 Other long term (current) drug therapy: Secondary | ICD-10-CM | POA: Diagnosis not present

## 2017-10-10 DIAGNOSIS — F411 Generalized anxiety disorder: Secondary | ICD-10-CM | POA: Diagnosis not present

## 2017-10-13 DIAGNOSIS — M5136 Other intervertebral disc degeneration, lumbar region: Secondary | ICD-10-CM | POA: Diagnosis not present

## 2017-10-13 DIAGNOSIS — M47816 Spondylosis without myelopathy or radiculopathy, lumbar region: Secondary | ICD-10-CM | POA: Diagnosis not present

## 2017-10-15 DIAGNOSIS — Z79899 Other long term (current) drug therapy: Secondary | ICD-10-CM | POA: Diagnosis not present

## 2017-10-15 DIAGNOSIS — F411 Generalized anxiety disorder: Secondary | ICD-10-CM | POA: Diagnosis not present

## 2017-10-17 DIAGNOSIS — E782 Mixed hyperlipidemia: Secondary | ICD-10-CM | POA: Diagnosis not present

## 2017-10-17 DIAGNOSIS — K219 Gastro-esophageal reflux disease without esophagitis: Secondary | ICD-10-CM | POA: Diagnosis not present

## 2017-10-17 DIAGNOSIS — Z1389 Encounter for screening for other disorder: Secondary | ICD-10-CM | POA: Diagnosis not present

## 2017-10-17 DIAGNOSIS — Z0000 Encounter for general adult medical examination without abnormal findings: Secondary | ICD-10-CM | POA: Diagnosis not present

## 2017-11-08 DIAGNOSIS — M797 Fibromyalgia: Secondary | ICD-10-CM | POA: Diagnosis not present

## 2017-11-08 DIAGNOSIS — Z79899 Other long term (current) drug therapy: Secondary | ICD-10-CM | POA: Diagnosis not present

## 2017-11-08 DIAGNOSIS — M5136 Other intervertebral disc degeneration, lumbar region: Secondary | ICD-10-CM | POA: Diagnosis not present

## 2017-12-06 DIAGNOSIS — Z79899 Other long term (current) drug therapy: Secondary | ICD-10-CM | POA: Diagnosis not present

## 2017-12-06 DIAGNOSIS — M797 Fibromyalgia: Secondary | ICD-10-CM | POA: Diagnosis not present

## 2017-12-06 DIAGNOSIS — M5136 Other intervertebral disc degeneration, lumbar region: Secondary | ICD-10-CM | POA: Diagnosis not present

## 2018-01-03 DIAGNOSIS — M5136 Other intervertebral disc degeneration, lumbar region: Secondary | ICD-10-CM | POA: Diagnosis not present

## 2018-01-03 DIAGNOSIS — Z79899 Other long term (current) drug therapy: Secondary | ICD-10-CM | POA: Diagnosis not present

## 2018-01-03 DIAGNOSIS — M797 Fibromyalgia: Secondary | ICD-10-CM | POA: Diagnosis not present

## 2018-01-16 DIAGNOSIS — K219 Gastro-esophageal reflux disease without esophagitis: Secondary | ICD-10-CM | POA: Diagnosis not present

## 2018-01-16 DIAGNOSIS — E782 Mixed hyperlipidemia: Secondary | ICD-10-CM | POA: Diagnosis not present

## 2018-02-01 DIAGNOSIS — M797 Fibromyalgia: Secondary | ICD-10-CM | POA: Diagnosis not present

## 2018-02-01 DIAGNOSIS — Z79899 Other long term (current) drug therapy: Secondary | ICD-10-CM | POA: Diagnosis not present

## 2018-02-01 DIAGNOSIS — M5136 Other intervertebral disc degeneration, lumbar region: Secondary | ICD-10-CM | POA: Diagnosis not present

## 2018-03-02 DIAGNOSIS — M5136 Other intervertebral disc degeneration, lumbar region: Secondary | ICD-10-CM | POA: Diagnosis not present

## 2018-03-02 DIAGNOSIS — Z79899 Other long term (current) drug therapy: Secondary | ICD-10-CM | POA: Diagnosis not present

## 2018-03-02 DIAGNOSIS — M797 Fibromyalgia: Secondary | ICD-10-CM | POA: Diagnosis not present

## 2018-03-02 DIAGNOSIS — G8929 Other chronic pain: Secondary | ICD-10-CM | POA: Diagnosis not present

## 2018-03-02 DIAGNOSIS — M545 Low back pain: Secondary | ICD-10-CM | POA: Diagnosis not present

## 2018-03-28 DIAGNOSIS — Z79899 Other long term (current) drug therapy: Secondary | ICD-10-CM | POA: Diagnosis not present

## 2018-03-28 DIAGNOSIS — F410 Panic disorder [episodic paroxysmal anxiety] without agoraphobia: Secondary | ICD-10-CM | POA: Diagnosis not present

## 2018-03-31 DIAGNOSIS — M797 Fibromyalgia: Secondary | ICD-10-CM | POA: Diagnosis not present

## 2018-03-31 DIAGNOSIS — M5136 Other intervertebral disc degeneration, lumbar region: Secondary | ICD-10-CM | POA: Diagnosis not present

## 2018-03-31 DIAGNOSIS — Z79899 Other long term (current) drug therapy: Secondary | ICD-10-CM | POA: Diagnosis not present

## 2018-04-05 DIAGNOSIS — F04 Amnestic disorder due to known physiological condition: Secondary | ICD-10-CM | POA: Diagnosis not present

## 2018-04-07 DIAGNOSIS — F04 Amnestic disorder due to known physiological condition: Secondary | ICD-10-CM | POA: Diagnosis not present

## 2018-04-07 DIAGNOSIS — Z79899 Other long term (current) drug therapy: Secondary | ICD-10-CM | POA: Diagnosis not present

## 2018-04-20 DIAGNOSIS — K210 Gastro-esophageal reflux disease with esophagitis: Secondary | ICD-10-CM | POA: Diagnosis not present

## 2018-04-20 DIAGNOSIS — E782 Mixed hyperlipidemia: Secondary | ICD-10-CM | POA: Diagnosis not present

## 2018-05-10 DIAGNOSIS — M797 Fibromyalgia: Secondary | ICD-10-CM | POA: Diagnosis not present

## 2018-05-10 DIAGNOSIS — M5136 Other intervertebral disc degeneration, lumbar region: Secondary | ICD-10-CM | POA: Diagnosis not present

## 2018-05-10 DIAGNOSIS — Z6824 Body mass index (BMI) 24.0-24.9, adult: Secondary | ICD-10-CM | POA: Diagnosis not present

## 2018-05-10 DIAGNOSIS — Z79899 Other long term (current) drug therapy: Secondary | ICD-10-CM | POA: Diagnosis not present

## 2018-06-07 DIAGNOSIS — Z79899 Other long term (current) drug therapy: Secondary | ICD-10-CM | POA: Diagnosis not present

## 2018-06-07 DIAGNOSIS — M5136 Other intervertebral disc degeneration, lumbar region: Secondary | ICD-10-CM | POA: Diagnosis not present

## 2018-06-07 DIAGNOSIS — M797 Fibromyalgia: Secondary | ICD-10-CM | POA: Diagnosis not present

## 2018-06-20 DIAGNOSIS — Z79899 Other long term (current) drug therapy: Secondary | ICD-10-CM | POA: Diagnosis not present

## 2018-06-20 DIAGNOSIS — F411 Generalized anxiety disorder: Secondary | ICD-10-CM | POA: Diagnosis not present

## 2018-07-11 DIAGNOSIS — M5442 Lumbago with sciatica, left side: Secondary | ICD-10-CM | POA: Diagnosis not present

## 2018-07-11 DIAGNOSIS — Z79899 Other long term (current) drug therapy: Secondary | ICD-10-CM | POA: Diagnosis not present

## 2018-07-11 DIAGNOSIS — M797 Fibromyalgia: Secondary | ICD-10-CM | POA: Diagnosis not present

## 2018-07-11 DIAGNOSIS — M5136 Other intervertebral disc degeneration, lumbar region: Secondary | ICD-10-CM | POA: Diagnosis not present

## 2018-07-11 DIAGNOSIS — M5441 Lumbago with sciatica, right side: Secondary | ICD-10-CM | POA: Diagnosis not present

## 2018-07-17 DIAGNOSIS — F250 Schizoaffective disorder, bipolar type: Secondary | ICD-10-CM | POA: Diagnosis not present

## 2018-07-17 DIAGNOSIS — Z79899 Other long term (current) drug therapy: Secondary | ICD-10-CM | POA: Diagnosis not present

## 2018-07-18 DIAGNOSIS — K210 Gastro-esophageal reflux disease with esophagitis: Secondary | ICD-10-CM | POA: Diagnosis not present

## 2018-07-18 DIAGNOSIS — E782 Mixed hyperlipidemia: Secondary | ICD-10-CM | POA: Diagnosis not present

## 2018-08-03 DIAGNOSIS — Z23 Encounter for immunization: Secondary | ICD-10-CM | POA: Diagnosis not present

## 2018-08-10 DIAGNOSIS — Z79899 Other long term (current) drug therapy: Secondary | ICD-10-CM | POA: Diagnosis not present

## 2018-08-10 DIAGNOSIS — M5136 Other intervertebral disc degeneration, lumbar region: Secondary | ICD-10-CM | POA: Diagnosis not present

## 2018-08-10 DIAGNOSIS — Z6824 Body mass index (BMI) 24.0-24.9, adult: Secondary | ICD-10-CM | POA: Diagnosis not present

## 2018-08-10 DIAGNOSIS — M797 Fibromyalgia: Secondary | ICD-10-CM | POA: Diagnosis not present

## 2018-08-30 DIAGNOSIS — Z79899 Other long term (current) drug therapy: Secondary | ICD-10-CM | POA: Diagnosis not present

## 2018-08-30 DIAGNOSIS — F410 Panic disorder [episodic paroxysmal anxiety] without agoraphobia: Secondary | ICD-10-CM | POA: Diagnosis not present

## 2018-09-05 DIAGNOSIS — Z79899 Other long term (current) drug therapy: Secondary | ICD-10-CM | POA: Diagnosis not present

## 2018-09-05 DIAGNOSIS — M797 Fibromyalgia: Secondary | ICD-10-CM | POA: Diagnosis not present

## 2018-09-05 DIAGNOSIS — M5136 Other intervertebral disc degeneration, lumbar region: Secondary | ICD-10-CM | POA: Diagnosis not present

## 2018-09-20 DIAGNOSIS — Z79899 Other long term (current) drug therapy: Secondary | ICD-10-CM | POA: Diagnosis not present

## 2018-09-20 DIAGNOSIS — F410 Panic disorder [episodic paroxysmal anxiety] without agoraphobia: Secondary | ICD-10-CM | POA: Diagnosis not present

## 2018-09-26 DIAGNOSIS — M797 Fibromyalgia: Secondary | ICD-10-CM | POA: Diagnosis not present

## 2018-09-26 DIAGNOSIS — M5136 Other intervertebral disc degeneration, lumbar region: Secondary | ICD-10-CM | POA: Diagnosis not present

## 2018-09-26 DIAGNOSIS — Z79899 Other long term (current) drug therapy: Secondary | ICD-10-CM | POA: Diagnosis not present

## 2018-09-27 DIAGNOSIS — F410 Panic disorder [episodic paroxysmal anxiety] without agoraphobia: Secondary | ICD-10-CM | POA: Diagnosis not present

## 2018-09-27 DIAGNOSIS — Z79899 Other long term (current) drug therapy: Secondary | ICD-10-CM | POA: Diagnosis not present

## 2018-10-09 DIAGNOSIS — Z79899 Other long term (current) drug therapy: Secondary | ICD-10-CM | POA: Diagnosis not present

## 2018-10-09 DIAGNOSIS — F410 Panic disorder [episodic paroxysmal anxiety] without agoraphobia: Secondary | ICD-10-CM | POA: Diagnosis not present

## 2018-10-18 DIAGNOSIS — Z0000 Encounter for general adult medical examination without abnormal findings: Secondary | ICD-10-CM | POA: Diagnosis not present

## 2018-10-18 DIAGNOSIS — E782 Mixed hyperlipidemia: Secondary | ICD-10-CM | POA: Diagnosis not present

## 2018-10-18 DIAGNOSIS — Z1389 Encounter for screening for other disorder: Secondary | ICD-10-CM | POA: Diagnosis not present

## 2018-10-18 DIAGNOSIS — Z131 Encounter for screening for diabetes mellitus: Secondary | ICD-10-CM | POA: Diagnosis not present

## 2018-10-18 DIAGNOSIS — K210 Gastro-esophageal reflux disease with esophagitis: Secondary | ICD-10-CM | POA: Diagnosis not present

## 2018-10-24 DIAGNOSIS — M797 Fibromyalgia: Secondary | ICD-10-CM | POA: Diagnosis not present

## 2018-10-24 DIAGNOSIS — Z79899 Other long term (current) drug therapy: Secondary | ICD-10-CM | POA: Diagnosis not present

## 2018-10-24 DIAGNOSIS — M5136 Other intervertebral disc degeneration, lumbar region: Secondary | ICD-10-CM | POA: Diagnosis not present

## 2018-10-26 DIAGNOSIS — F410 Panic disorder [episodic paroxysmal anxiety] without agoraphobia: Secondary | ICD-10-CM | POA: Diagnosis not present

## 2018-10-26 DIAGNOSIS — Z79899 Other long term (current) drug therapy: Secondary | ICD-10-CM | POA: Diagnosis not present

## 2018-11-06 DIAGNOSIS — Z1231 Encounter for screening mammogram for malignant neoplasm of breast: Secondary | ICD-10-CM | POA: Diagnosis not present

## 2018-11-30 DIAGNOSIS — M5136 Other intervertebral disc degeneration, lumbar region: Secondary | ICD-10-CM | POA: Diagnosis not present

## 2018-11-30 DIAGNOSIS — Z79899 Other long term (current) drug therapy: Secondary | ICD-10-CM | POA: Diagnosis not present

## 2018-11-30 DIAGNOSIS — M797 Fibromyalgia: Secondary | ICD-10-CM | POA: Diagnosis not present

## 2018-12-29 DIAGNOSIS — M797 Fibromyalgia: Secondary | ICD-10-CM | POA: Diagnosis not present

## 2018-12-29 DIAGNOSIS — Z6825 Body mass index (BMI) 25.0-25.9, adult: Secondary | ICD-10-CM | POA: Diagnosis not present

## 2018-12-29 DIAGNOSIS — Z79899 Other long term (current) drug therapy: Secondary | ICD-10-CM | POA: Diagnosis not present

## 2018-12-29 DIAGNOSIS — M5136 Other intervertebral disc degeneration, lumbar region: Secondary | ICD-10-CM | POA: Diagnosis not present

## 2019-01-17 DIAGNOSIS — E782 Mixed hyperlipidemia: Secondary | ICD-10-CM | POA: Diagnosis not present

## 2019-01-17 DIAGNOSIS — K210 Gastro-esophageal reflux disease with esophagitis: Secondary | ICD-10-CM | POA: Diagnosis not present

## 2019-01-29 DIAGNOSIS — M797 Fibromyalgia: Secondary | ICD-10-CM | POA: Diagnosis not present

## 2019-01-29 DIAGNOSIS — Z79899 Other long term (current) drug therapy: Secondary | ICD-10-CM | POA: Diagnosis not present

## 2019-01-29 DIAGNOSIS — M5136 Other intervertebral disc degeneration, lumbar region: Secondary | ICD-10-CM | POA: Diagnosis not present

## 2019-03-02 DIAGNOSIS — Z1159 Encounter for screening for other viral diseases: Secondary | ICD-10-CM | POA: Diagnosis not present

## 2019-03-02 DIAGNOSIS — M797 Fibromyalgia: Secondary | ICD-10-CM | POA: Diagnosis not present

## 2019-03-02 DIAGNOSIS — M5136 Other intervertebral disc degeneration, lumbar region: Secondary | ICD-10-CM | POA: Diagnosis not present

## 2019-03-02 DIAGNOSIS — Z79899 Other long term (current) drug therapy: Secondary | ICD-10-CM | POA: Diagnosis not present

## 2019-03-30 DIAGNOSIS — Z79899 Other long term (current) drug therapy: Secondary | ICD-10-CM | POA: Diagnosis not present

## 2019-03-30 DIAGNOSIS — M5136 Other intervertebral disc degeneration, lumbar region: Secondary | ICD-10-CM | POA: Diagnosis not present

## 2019-03-30 DIAGNOSIS — M797 Fibromyalgia: Secondary | ICD-10-CM | POA: Diagnosis not present

## 2019-04-02 DIAGNOSIS — F410 Panic disorder [episodic paroxysmal anxiety] without agoraphobia: Secondary | ICD-10-CM | POA: Diagnosis not present

## 2019-04-12 DIAGNOSIS — M545 Low back pain: Secondary | ICD-10-CM | POA: Diagnosis not present

## 2019-04-12 DIAGNOSIS — E782 Mixed hyperlipidemia: Secondary | ICD-10-CM | POA: Diagnosis not present

## 2019-04-12 DIAGNOSIS — K210 Gastro-esophageal reflux disease with esophagitis: Secondary | ICD-10-CM | POA: Diagnosis not present

## 2019-04-26 DIAGNOSIS — M5136 Other intervertebral disc degeneration, lumbar region: Secondary | ICD-10-CM | POA: Diagnosis not present

## 2019-04-26 DIAGNOSIS — Z6825 Body mass index (BMI) 25.0-25.9, adult: Secondary | ICD-10-CM | POA: Diagnosis not present

## 2019-04-26 DIAGNOSIS — Z79899 Other long term (current) drug therapy: Secondary | ICD-10-CM | POA: Diagnosis not present

## 2019-04-26 DIAGNOSIS — M797 Fibromyalgia: Secondary | ICD-10-CM | POA: Diagnosis not present

## 2019-05-24 DIAGNOSIS — M5136 Other intervertebral disc degeneration, lumbar region: Secondary | ICD-10-CM | POA: Diagnosis not present

## 2019-05-24 DIAGNOSIS — Z79899 Other long term (current) drug therapy: Secondary | ICD-10-CM | POA: Diagnosis not present

## 2019-05-24 DIAGNOSIS — M797 Fibromyalgia: Secondary | ICD-10-CM | POA: Diagnosis not present

## 2019-05-24 DIAGNOSIS — Z6826 Body mass index (BMI) 26.0-26.9, adult: Secondary | ICD-10-CM | POA: Diagnosis not present

## 2019-06-28 DIAGNOSIS — M797 Fibromyalgia: Secondary | ICD-10-CM | POA: Diagnosis not present

## 2019-06-28 DIAGNOSIS — M5136 Other intervertebral disc degeneration, lumbar region: Secondary | ICD-10-CM | POA: Diagnosis not present

## 2019-06-28 DIAGNOSIS — Z79899 Other long term (current) drug therapy: Secondary | ICD-10-CM | POA: Diagnosis not present

## 2019-06-29 DIAGNOSIS — Z23 Encounter for immunization: Secondary | ICD-10-CM | POA: Diagnosis not present

## 2019-07-17 DIAGNOSIS — M545 Low back pain: Secondary | ICD-10-CM | POA: Diagnosis not present

## 2019-07-17 DIAGNOSIS — Z131 Encounter for screening for diabetes mellitus: Secondary | ICD-10-CM | POA: Diagnosis not present

## 2019-07-17 DIAGNOSIS — E782 Mixed hyperlipidemia: Secondary | ICD-10-CM | POA: Diagnosis not present

## 2019-07-17 DIAGNOSIS — K219 Gastro-esophageal reflux disease without esophagitis: Secondary | ICD-10-CM | POA: Diagnosis not present

## 2019-07-26 DIAGNOSIS — Z79899 Other long term (current) drug therapy: Secondary | ICD-10-CM | POA: Diagnosis not present

## 2019-07-26 DIAGNOSIS — M797 Fibromyalgia: Secondary | ICD-10-CM | POA: Diagnosis not present

## 2019-07-26 DIAGNOSIS — M5136 Other intervertebral disc degeneration, lumbar region: Secondary | ICD-10-CM | POA: Diagnosis not present

## 2019-09-04 DIAGNOSIS — M5136 Other intervertebral disc degeneration, lumbar region: Secondary | ICD-10-CM | POA: Diagnosis not present

## 2019-09-04 DIAGNOSIS — M797 Fibromyalgia: Secondary | ICD-10-CM | POA: Diagnosis not present

## 2019-09-04 DIAGNOSIS — Z1159 Encounter for screening for other viral diseases: Secondary | ICD-10-CM | POA: Diagnosis not present

## 2019-09-04 DIAGNOSIS — Z79899 Other long term (current) drug therapy: Secondary | ICD-10-CM | POA: Diagnosis not present

## 2019-09-28 DIAGNOSIS — M5136 Other intervertebral disc degeneration, lumbar region: Secondary | ICD-10-CM | POA: Diagnosis not present

## 2019-09-28 DIAGNOSIS — Z79899 Other long term (current) drug therapy: Secondary | ICD-10-CM | POA: Diagnosis not present

## 2019-09-28 DIAGNOSIS — M797 Fibromyalgia: Secondary | ICD-10-CM | POA: Diagnosis not present

## 2019-09-28 DIAGNOSIS — Z1159 Encounter for screening for other viral diseases: Secondary | ICD-10-CM | POA: Diagnosis not present

## 2019-10-24 DIAGNOSIS — Z0000 Encounter for general adult medical examination without abnormal findings: Secondary | ICD-10-CM | POA: Diagnosis not present

## 2019-10-24 DIAGNOSIS — E782 Mixed hyperlipidemia: Secondary | ICD-10-CM | POA: Diagnosis not present

## 2019-10-24 DIAGNOSIS — M545 Low back pain: Secondary | ICD-10-CM | POA: Diagnosis not present

## 2019-10-24 DIAGNOSIS — K2100 Gastro-esophageal reflux disease with esophagitis, without bleeding: Secondary | ICD-10-CM | POA: Diagnosis not present

## 2019-10-24 DIAGNOSIS — Z1389 Encounter for screening for other disorder: Secondary | ICD-10-CM | POA: Diagnosis not present

## 2020-01-30 DIAGNOSIS — M545 Low back pain: Secondary | ICD-10-CM | POA: Diagnosis not present

## 2020-01-30 DIAGNOSIS — Z1389 Encounter for screening for other disorder: Secondary | ICD-10-CM | POA: Diagnosis not present

## 2020-01-30 DIAGNOSIS — Z131 Encounter for screening for diabetes mellitus: Secondary | ICD-10-CM | POA: Diagnosis not present

## 2020-01-30 DIAGNOSIS — K2100 Gastro-esophageal reflux disease with esophagitis, without bleeding: Secondary | ICD-10-CM | POA: Diagnosis not present

## 2020-01-30 DIAGNOSIS — E782 Mixed hyperlipidemia: Secondary | ICD-10-CM | POA: Diagnosis not present

## 2020-01-30 DIAGNOSIS — Z0000 Encounter for general adult medical examination without abnormal findings: Secondary | ICD-10-CM | POA: Diagnosis not present

## 2020-02-12 DIAGNOSIS — Z124 Encounter for screening for malignant neoplasm of cervix: Secondary | ICD-10-CM | POA: Diagnosis not present

## 2020-02-12 DIAGNOSIS — N951 Menopausal and female climacteric states: Secondary | ICD-10-CM | POA: Diagnosis not present

## 2020-02-12 DIAGNOSIS — Z1239 Encounter for other screening for malignant neoplasm of breast: Secondary | ICD-10-CM | POA: Diagnosis not present

## 2020-02-12 DIAGNOSIS — Z01411 Encounter for gynecological examination (general) (routine) with abnormal findings: Secondary | ICD-10-CM | POA: Diagnosis not present

## 2020-02-27 DIAGNOSIS — R102 Pelvic and perineal pain: Secondary | ICD-10-CM | POA: Diagnosis not present

## 2020-03-14 DIAGNOSIS — R102 Pelvic and perineal pain: Secondary | ICD-10-CM | POA: Diagnosis not present

## 2020-03-14 DIAGNOSIS — R800 Isolated proteinuria: Secondary | ICD-10-CM | POA: Diagnosis not present

## 2020-03-14 DIAGNOSIS — N94810 Vulvar vestibulitis: Secondary | ICD-10-CM | POA: Diagnosis not present

## 2020-03-14 DIAGNOSIS — R8290 Unspecified abnormal findings in urine: Secondary | ICD-10-CM | POA: Diagnosis not present

## 2020-05-06 DIAGNOSIS — E782 Mixed hyperlipidemia: Secondary | ICD-10-CM | POA: Diagnosis not present

## 2020-05-06 DIAGNOSIS — R102 Pelvic and perineal pain: Secondary | ICD-10-CM | POA: Diagnosis not present

## 2020-05-06 DIAGNOSIS — K2100 Gastro-esophageal reflux disease with esophagitis, without bleeding: Secondary | ICD-10-CM | POA: Diagnosis not present

## 2020-05-06 DIAGNOSIS — M545 Low back pain: Secondary | ICD-10-CM | POA: Diagnosis not present

## 2020-05-20 DIAGNOSIS — Z1231 Encounter for screening mammogram for malignant neoplasm of breast: Secondary | ICD-10-CM | POA: Diagnosis not present

## 2020-05-23 ENCOUNTER — Encounter: Payer: Self-pay | Admitting: Adult Health

## 2020-05-23 ENCOUNTER — Other Ambulatory Visit (HOSPITAL_COMMUNITY)
Admission: RE | Admit: 2020-05-23 | Discharge: 2020-05-23 | Disposition: A | Discharge status: Home / Self Care | Payer: Medicare Other | Source: Ambulatory Visit | Attending: Adult Health | Admitting: Adult Health

## 2020-05-23 ENCOUNTER — Ambulatory Visit (INDEPENDENT_AMBULATORY_CARE_PROVIDER_SITE_OTHER): Payer: Medicare Other | Admitting: Adult Health

## 2020-05-23 VITALS — BP 122/73 | HR 83 | Ht 65.0 in | Wt 156.0 lb

## 2020-05-23 DIAGNOSIS — R102 Pelvic and perineal pain: Secondary | ICD-10-CM | POA: Diagnosis not present

## 2020-05-23 DIAGNOSIS — Z1211 Encounter for screening for malignant neoplasm of colon: Secondary | ICD-10-CM | POA: Diagnosis not present

## 2020-05-23 DIAGNOSIS — F79 Unspecified intellectual disabilities: Secondary | ICD-10-CM | POA: Diagnosis not present

## 2020-05-23 DIAGNOSIS — Z01419 Encounter for gynecological examination (general) (routine) without abnormal findings: Secondary | ICD-10-CM

## 2020-05-23 DIAGNOSIS — Z1151 Encounter for screening for human papillomavirus (HPV): Secondary | ICD-10-CM | POA: Insufficient documentation

## 2020-05-23 LAB — HEMOCCULT GUIAC POC 1CARD (OFFICE): Fecal Occult Blood, POC: NEGATIVE

## 2020-05-23 NOTE — Addendum Note (Signed)
Addended by: Annamarie Dawley on: 05/23/2020 11:35 AM   Modules accepted: Orders

## 2020-05-23 NOTE — Progress Notes (Addendum)
  Subjective:     Patient ID: Robyn Keith, female   DOB: [birth year], 59 y.o.   MRN: [id number]  HPI Robyn Keith is a 59 year old white female, single, G0P0, in for pap and pelvic and has vaginal pain at times. Robyn Keith has mental disability and her sister is with her.  She said has been hurt when having pap in the past. Had CT at [facility] that was normal. PCP is Dr Olena Leatherwood.   Review of Systems Pain in vaginal at times, can be sharp No vaginal bleeding Has never had sex Reviewed past medical,surgical, social and family history. Reviewed medications and allergies.     Objective:   Physical Exam BP 122/73 (BP Location: Left Arm, Patient Position: Sitting, Cuff Size: Normal)   Pulse 83   Ht 5\' 5"  (1.651 m)   Wt 156 lb (70.8 kg)   BMI 25.96 kg/m  Skin warm and dry.Pelvic: external genitalia is normal in appearance no lesions, vagina:pale with loss of moisture and rugae,urethra has no lesions or masses noted, cervix:smooth, and poorly visualized, pap with high risk HPV genotyping performed,, uterus: normal size, shape and contour, non tender, no masses felt, adnexa: no masses or tenderness noted. Bladder is non tender and no masses felt.On rectal rexam has good tone, no masses and hemoccult is negative. Used pediatric speculum.And she tolerated well.  AA is 0 Fall risk is low PHQ 9 score is 12, no SI, is on meds Examination chaperoned by LPN    Assessment:     1. Encounter for gynecological examination with Papanicolaou smear of cervix Pap sent Pap in 3 if normal Physical with PCP  2. Encounter for screening fecal occult blood testing  3. Mental disability  4. Vaginal pain Try replens     Plan:     Pap in 3 years if normal

## 2020-05-23 NOTE — Addendum Note (Signed)
Addended by: Cyril Mourning A on: 05/23/2020 11:27 AM   Modules accepted: Orders

## 2020-05-28 LAB — CYTOLOGY - PAP
Comment: NEGATIVE
Diagnosis: NEGATIVE
High risk HPV: NEGATIVE

## 2020-08-07 DIAGNOSIS — Z131 Encounter for screening for diabetes mellitus: Secondary | ICD-10-CM | POA: Diagnosis not present

## 2020-08-07 DIAGNOSIS — K2100 Gastro-esophageal reflux disease with esophagitis, without bleeding: Secondary | ICD-10-CM | POA: Diagnosis not present

## 2020-08-07 DIAGNOSIS — R102 Pelvic and perineal pain: Secondary | ICD-10-CM | POA: Diagnosis not present

## 2020-08-07 DIAGNOSIS — E782 Mixed hyperlipidemia: Secondary | ICD-10-CM | POA: Diagnosis not present

## 2020-08-07 DIAGNOSIS — M545 Low back pain, unspecified: Secondary | ICD-10-CM | POA: Diagnosis not present

## 2020-11-05 DIAGNOSIS — Z23 Encounter for immunization: Secondary | ICD-10-CM | POA: Diagnosis not present

## 2020-11-10 DIAGNOSIS — M545 Low back pain, unspecified: Secondary | ICD-10-CM | POA: Diagnosis not present

## 2020-11-10 DIAGNOSIS — K2100 Gastro-esophageal reflux disease with esophagitis, without bleeding: Secondary | ICD-10-CM | POA: Diagnosis not present

## 2020-11-10 DIAGNOSIS — E782 Mixed hyperlipidemia: Secondary | ICD-10-CM | POA: Diagnosis not present

## 2020-11-10 DIAGNOSIS — R102 Pelvic and perineal pain: Secondary | ICD-10-CM | POA: Diagnosis not present

## 2021-01-07 DIAGNOSIS — K2100 Gastro-esophageal reflux disease with esophagitis, without bleeding: Secondary | ICD-10-CM | POA: Diagnosis not present

## 2021-01-07 DIAGNOSIS — G459 Transient cerebral ischemic attack, unspecified: Secondary | ICD-10-CM | POA: Diagnosis not present

## 2021-01-07 DIAGNOSIS — E782 Mixed hyperlipidemia: Secondary | ICD-10-CM | POA: Diagnosis not present

## 2021-01-07 DIAGNOSIS — R102 Pelvic and perineal pain: Secondary | ICD-10-CM | POA: Diagnosis not present

## 2021-01-07 DIAGNOSIS — M545 Low back pain, unspecified: Secondary | ICD-10-CM | POA: Diagnosis not present

## 2021-01-15 DIAGNOSIS — I6782 Cerebral ischemia: Secondary | ICD-10-CM | POA: Diagnosis not present

## 2021-01-15 DIAGNOSIS — G459 Transient cerebral ischemic attack, unspecified: Secondary | ICD-10-CM | POA: Diagnosis not present

## 2021-01-15 DIAGNOSIS — J3489 Other specified disorders of nose and nasal sinuses: Secondary | ICD-10-CM | POA: Diagnosis not present

## 2021-04-01 DIAGNOSIS — R102 Pelvic and perineal pain: Secondary | ICD-10-CM | POA: Diagnosis not present

## 2021-04-01 DIAGNOSIS — M545 Low back pain, unspecified: Secondary | ICD-10-CM | POA: Diagnosis not present

## 2021-04-01 DIAGNOSIS — K2100 Gastro-esophageal reflux disease with esophagitis, without bleeding: Secondary | ICD-10-CM | POA: Diagnosis not present

## 2021-04-01 DIAGNOSIS — Z1331 Encounter for screening for depression: Secondary | ICD-10-CM | POA: Diagnosis not present

## 2021-04-01 DIAGNOSIS — E782 Mixed hyperlipidemia: Secondary | ICD-10-CM | POA: Diagnosis not present

## 2021-04-01 DIAGNOSIS — Z0000 Encounter for general adult medical examination without abnormal findings: Secondary | ICD-10-CM | POA: Diagnosis not present

## 2021-04-01 DIAGNOSIS — G459 Transient cerebral ischemic attack, unspecified: Secondary | ICD-10-CM | POA: Diagnosis not present

## 2021-04-01 DIAGNOSIS — Z131 Encounter for screening for diabetes mellitus: Secondary | ICD-10-CM | POA: Diagnosis not present

## 2021-05-28 DIAGNOSIS — M79603 Pain in arm, unspecified: Secondary | ICD-10-CM | POA: Diagnosis not present

## 2021-05-28 DIAGNOSIS — E782 Mixed hyperlipidemia: Secondary | ICD-10-CM | POA: Diagnosis not present

## 2021-05-28 DIAGNOSIS — R102 Pelvic and perineal pain: Secondary | ICD-10-CM | POA: Diagnosis not present

## 2021-05-28 DIAGNOSIS — G459 Transient cerebral ischemic attack, unspecified: Secondary | ICD-10-CM | POA: Diagnosis not present

## 2021-05-28 DIAGNOSIS — M545 Low back pain, unspecified: Secondary | ICD-10-CM | POA: Diagnosis not present

## 2021-05-28 DIAGNOSIS — K2100 Gastro-esophageal reflux disease with esophagitis, without bleeding: Secondary | ICD-10-CM | POA: Diagnosis not present

## 2021-07-24 DIAGNOSIS — Z23 Encounter for immunization: Secondary | ICD-10-CM | POA: Diagnosis not present

## 2021-07-24 DIAGNOSIS — Z20828 Contact with and (suspected) exposure to other viral communicable diseases: Secondary | ICD-10-CM | POA: Diagnosis not present

## 2021-08-13 ENCOUNTER — Ambulatory Visit: Payer: Medicare Other | Admitting: Neurology

## 2021-08-31 DIAGNOSIS — Z20828 Contact with and (suspected) exposure to other viral communicable diseases: Secondary | ICD-10-CM | POA: Diagnosis not present

## 2021-09-08 DIAGNOSIS — K2100 Gastro-esophageal reflux disease with esophagitis, without bleeding: Secondary | ICD-10-CM | POA: Diagnosis not present

## 2021-09-08 DIAGNOSIS — Z131 Encounter for screening for diabetes mellitus: Secondary | ICD-10-CM | POA: Diagnosis not present

## 2021-09-08 DIAGNOSIS — E782 Mixed hyperlipidemia: Secondary | ICD-10-CM | POA: Diagnosis not present

## 2021-09-08 DIAGNOSIS — L309 Dermatitis, unspecified: Secondary | ICD-10-CM | POA: Diagnosis not present

## 2021-09-08 DIAGNOSIS — M545 Low back pain, unspecified: Secondary | ICD-10-CM | POA: Diagnosis not present

## 2021-09-08 DIAGNOSIS — F259 Schizoaffective disorder, unspecified: Secondary | ICD-10-CM | POA: Diagnosis not present

## 2021-09-30 DIAGNOSIS — Z20828 Contact with and (suspected) exposure to other viral communicable diseases: Secondary | ICD-10-CM | POA: Diagnosis not present

## 2021-12-09 DIAGNOSIS — F411 Generalized anxiety disorder: Secondary | ICD-10-CM | POA: Diagnosis not present

## 2021-12-09 DIAGNOSIS — E782 Mixed hyperlipidemia: Secondary | ICD-10-CM | POA: Diagnosis not present

## 2021-12-09 DIAGNOSIS — F259 Schizoaffective disorder, unspecified: Secondary | ICD-10-CM | POA: Diagnosis not present

## 2021-12-09 DIAGNOSIS — M545 Low back pain, unspecified: Secondary | ICD-10-CM | POA: Diagnosis not present

## 2021-12-09 DIAGNOSIS — K2100 Gastro-esophageal reflux disease with esophagitis, without bleeding: Secondary | ICD-10-CM | POA: Diagnosis not present

## 2022-03-16 DIAGNOSIS — F411 Generalized anxiety disorder: Secondary | ICD-10-CM | POA: Diagnosis not present

## 2022-03-16 DIAGNOSIS — F259 Schizoaffective disorder, unspecified: Secondary | ICD-10-CM | POA: Diagnosis not present

## 2022-03-16 DIAGNOSIS — K2100 Gastro-esophageal reflux disease with esophagitis, without bleeding: Secondary | ICD-10-CM | POA: Diagnosis not present

## 2022-03-16 DIAGNOSIS — E782 Mixed hyperlipidemia: Secondary | ICD-10-CM | POA: Diagnosis not present

## 2022-03-16 DIAGNOSIS — M545 Low back pain, unspecified: Secondary | ICD-10-CM | POA: Diagnosis not present

## 2022-06-22 DIAGNOSIS — F259 Schizoaffective disorder, unspecified: Secondary | ICD-10-CM | POA: Diagnosis not present

## 2022-06-22 DIAGNOSIS — F411 Generalized anxiety disorder: Secondary | ICD-10-CM | POA: Diagnosis not present

## 2022-06-22 DIAGNOSIS — E782 Mixed hyperlipidemia: Secondary | ICD-10-CM | POA: Diagnosis not present

## 2022-06-22 DIAGNOSIS — M545 Low back pain, unspecified: Secondary | ICD-10-CM | POA: Diagnosis not present

## 2022-06-22 DIAGNOSIS — Z0000 Encounter for general adult medical examination without abnormal findings: Secondary | ICD-10-CM | POA: Diagnosis not present

## 2022-06-22 DIAGNOSIS — Z1331 Encounter for screening for depression: Secondary | ICD-10-CM | POA: Diagnosis not present

## 2022-06-22 DIAGNOSIS — K2100 Gastro-esophageal reflux disease with esophagitis, without bleeding: Secondary | ICD-10-CM | POA: Diagnosis not present

## 2022-07-10 DIAGNOSIS — Z23 Encounter for immunization: Secondary | ICD-10-CM | POA: Diagnosis not present

## 2022-09-29 DIAGNOSIS — F259 Schizoaffective disorder, unspecified: Secondary | ICD-10-CM | POA: Diagnosis not present

## 2022-09-29 DIAGNOSIS — F411 Generalized anxiety disorder: Secondary | ICD-10-CM | POA: Diagnosis not present

## 2022-09-29 DIAGNOSIS — M545 Low back pain, unspecified: Secondary | ICD-10-CM | POA: Diagnosis not present

## 2022-09-29 DIAGNOSIS — K2100 Gastro-esophageal reflux disease with esophagitis, without bleeding: Secondary | ICD-10-CM | POA: Diagnosis not present

## 2022-09-29 DIAGNOSIS — E782 Mixed hyperlipidemia: Secondary | ICD-10-CM | POA: Diagnosis not present

## 2022-12-29 DIAGNOSIS — F259 Schizoaffective disorder, unspecified: Secondary | ICD-10-CM | POA: Diagnosis not present

## 2022-12-29 DIAGNOSIS — K2100 Gastro-esophageal reflux disease with esophagitis, without bleeding: Secondary | ICD-10-CM | POA: Diagnosis not present

## 2022-12-29 DIAGNOSIS — E782 Mixed hyperlipidemia: Secondary | ICD-10-CM | POA: Diagnosis not present

## 2022-12-29 DIAGNOSIS — F411 Generalized anxiety disorder: Secondary | ICD-10-CM | POA: Diagnosis not present

## 2022-12-29 DIAGNOSIS — M545 Low back pain, unspecified: Secondary | ICD-10-CM | POA: Diagnosis not present

## 2023-01-10 DIAGNOSIS — Z1231 Encounter for screening mammogram for malignant neoplasm of breast: Secondary | ICD-10-CM | POA: Diagnosis not present

## 2023-03-28 DIAGNOSIS — M5459 Other low back pain: Secondary | ICD-10-CM | POA: Diagnosis not present

## 2023-03-28 DIAGNOSIS — F411 Generalized anxiety disorder: Secondary | ICD-10-CM | POA: Diagnosis not present

## 2023-03-28 DIAGNOSIS — E782 Mixed hyperlipidemia: Secondary | ICD-10-CM | POA: Diagnosis not present

## 2023-03-28 DIAGNOSIS — M545 Low back pain, unspecified: Secondary | ICD-10-CM | POA: Diagnosis not present

## 2023-03-28 DIAGNOSIS — K2100 Gastro-esophageal reflux disease with esophagitis, without bleeding: Secondary | ICD-10-CM | POA: Diagnosis not present

## 2023-03-28 DIAGNOSIS — F259 Schizoaffective disorder, unspecified: Secondary | ICD-10-CM | POA: Diagnosis not present

## 2023-06-27 DIAGNOSIS — K2100 Gastro-esophageal reflux disease with esophagitis, without bleeding: Secondary | ICD-10-CM | POA: Diagnosis not present

## 2023-06-27 DIAGNOSIS — E782 Mixed hyperlipidemia: Secondary | ICD-10-CM | POA: Diagnosis not present

## 2023-06-27 DIAGNOSIS — Z0000 Encounter for general adult medical examination without abnormal findings: Secondary | ICD-10-CM | POA: Diagnosis not present

## 2023-06-27 DIAGNOSIS — M5459 Other low back pain: Secondary | ICD-10-CM | POA: Diagnosis not present

## 2023-06-27 DIAGNOSIS — F411 Generalized anxiety disorder: Secondary | ICD-10-CM | POA: Diagnosis not present

## 2023-06-27 DIAGNOSIS — Z1331 Encounter for screening for depression: Secondary | ICD-10-CM | POA: Diagnosis not present

## 2023-06-27 DIAGNOSIS — F259 Schizoaffective disorder, unspecified: Secondary | ICD-10-CM | POA: Diagnosis not present

## 2023-08-10 ENCOUNTER — Ambulatory Visit: Payer: Medicare Other | Admitting: Adult Health

## 2023-09-21 ENCOUNTER — Other Ambulatory Visit (HOSPITAL_COMMUNITY)
Admission: RE | Admit: 2023-09-21 | Discharge: 2023-09-21 | Disposition: A | Discharge status: Home / Self Care | Payer: Medicare Other | Source: Ambulatory Visit | Attending: Adult Health | Admitting: Adult Health

## 2023-09-21 ENCOUNTER — Encounter: Payer: Self-pay | Admitting: Adult Health

## 2023-09-21 ENCOUNTER — Ambulatory Visit: Payer: Medicare Other | Admitting: Adult Health

## 2023-09-21 VITALS — BP 115/74 | HR 89 | Ht 65.0 in | Wt 147.0 lb

## 2023-09-21 DIAGNOSIS — Z0000 Encounter for general adult medical examination without abnormal findings: Secondary | ICD-10-CM

## 2023-09-21 DIAGNOSIS — Z01419 Encounter for gynecological examination (general) (routine) without abnormal findings: Secondary | ICD-10-CM

## 2023-09-21 DIAGNOSIS — Z1331 Encounter for screening for depression: Secondary | ICD-10-CM | POA: Diagnosis not present

## 2023-09-21 DIAGNOSIS — Z1151 Encounter for screening for human papillomavirus (HPV): Secondary | ICD-10-CM | POA: Diagnosis not present

## 2023-09-21 DIAGNOSIS — F79 Unspecified intellectual disabilities: Secondary | ICD-10-CM | POA: Diagnosis not present

## 2023-09-21 DIAGNOSIS — Z1211 Encounter for screening for malignant neoplasm of colon: Secondary | ICD-10-CM

## 2023-09-21 DIAGNOSIS — Z124 Encounter for screening for malignant neoplasm of cervix: Secondary | ICD-10-CM

## 2023-09-21 LAB — HEMOCCULT GUIAC POC 1CARD (OFFICE): Fecal Occult Blood, POC: NEGATIVE

## 2023-09-21 NOTE — Addendum Note (Signed)
Addended by: Freddie Apley R on: 09/21/2023 11:12 AM   Modules accepted: Orders

## 2023-09-21 NOTE — Progress Notes (Signed)
Patient ID: Robyn Keith, female   DOB: June 15, 1961, 62 y.o.   MRN: [id number] History of Present Illness: Robyn Keith is a 62 year old white female,single, PM in for a gyn exam and pap. She had her physical with PCP 06/27/23. Her sister is with her, Robyn Keith has mental disability.   PCP is Dr Olena Leatherwood   Current Medications, Allergies, Past Medical History, Past Surgical History, Family History and Social History were reviewed in [employer] electronic medical record.     Review of Systems: Patient denies any problems with bowel movements, urination(does not pee often per sister), or intercourse(has never had sex). No joint pain or mood swings.  Has body aches at times    Physical Exam:BP 115/74 (BP Location: Left Arm, Patient Position: Sitting, Cuff Size: Normal)   Pulse 89   Ht 5\' 5"  (1.651 m)   Wt 147 lb (66.7 kg)   BMI 24.46 kg/m   General:  Well developed, well nourished, no acute distress Skin:  Warm and dry Lungs; Clear to auscultation bilaterally Breast:  No dominant palpable mass, retraction, or nipple discharge Cardiovascular: Regular rate and rhythm Pelvic:  External genitalia is normal in appearance, no lesions.  The vagina is pale, using small slender Pedersen speculum. Urethra has no lesions or masses. The cervix is smooth, and poorly visualized, app with HR HPV genotyping performed. Marland Kitchen  Uterus is felt to be normal size, shape, and contour.  No adnexal masses or tenderness noted.Bladder is non tender, no masses felt. Rectal: Good sphincter tone, no polyps, or hemorrhoids felt.  Hemoccult negative. Extremities/musculoskeletal:  No swelling or varicosities noted, no clubbing or cyanosis Psych:  No mood changes, alert and cooperative,seems happy AA is 0 Fall risk is low    09/21/2023   10:14 AM 05/23/2020   10:11 AM  Depression screen PHQ 2/9  Decreased Interest 0 0  Down, Depressed, Hopeless 1 1  PHQ - 2 Score 1 1  Altered sleeping 1 3  Tired, decreased energy 0 2  Change in  appetite 0 0  Feeling bad or failure about yourself  0 0  Trouble concentrating 3 3  Moving slowly or fidgety/restless 0 3  Suicidal thoughts 0 0  PHQ-9 Score 5 12       09/21/2023   10:15 AM 05/23/2020   10:19 AM  GAD 7 : Generalized Anxiety Score  Nervous, Anxious, on Edge 1 3  Control/stop worrying 0 3  Worry too much - different things 1 3  Trouble relaxing 1 3  Restless 1 3  Easily annoyed or irritable 0 3  Afraid - awful might happen 0 0  Total GAD 7 Score 4 18    Upstream - 09/21/23 1025       Pregnancy Intention Screening   Does the patient want to become pregnant in the next year? N/A    Does the patient's partner want to become pregnant in the next year? N/A    Would the patient like to discuss contraceptive options today? N/A      Contraception Wrap Up   Current Method --   PM   End Method --   PM, has never had sex   Contraception Counseling Provided No              Examination chaperoned by Freddie Apley RN   Impression and Plan: 1. Encounter for gynecological examination with Papanicolaou smear of cervix Pap sent Pap in 3 years if normal Physical and labs with PCP Had negative mammogram  01/10/23 Colonoscopy per GI  2. Encounter for screening fecal occult blood testing Hemoccult was negative  - POCT occult blood stool  3. Mental disability

## 2023-09-22 LAB — CYTOLOGY - PAP
Comment: NEGATIVE
Diagnosis: NEGATIVE
High risk HPV: NEGATIVE

## 2023-09-26 ENCOUNTER — Telehealth: Payer: Self-pay | Admitting: *Deleted

## 2023-09-26 NOTE — Telephone Encounter (Signed)
Voice mail not set up @ 10:05 am. [employer]

## 2023-09-26 NOTE — Telephone Encounter (Signed)
-----   Message from Cyril Mourning sent at 09/26/2023  9:49 AM EST ----- Will you let her know about pap. THX

## 2023-09-26 NOTE — Telephone Encounter (Signed)
Pt aware pap was negative for HPV and malignancy; does show atrophy which comes with age. Next pap due in 3 years. Pt voiced understanding. JSY

## 2023-10-19 DIAGNOSIS — F259 Schizoaffective disorder, unspecified: Secondary | ICD-10-CM | POA: Diagnosis not present

## 2023-10-19 DIAGNOSIS — Z1331 Encounter for screening for depression: Secondary | ICD-10-CM | POA: Diagnosis not present

## 2023-10-19 DIAGNOSIS — M5459 Other low back pain: Secondary | ICD-10-CM | POA: Diagnosis not present

## 2023-10-19 DIAGNOSIS — E782 Mixed hyperlipidemia: Secondary | ICD-10-CM | POA: Diagnosis not present

## 2023-10-19 DIAGNOSIS — Z0000 Encounter for general adult medical examination without abnormal findings: Secondary | ICD-10-CM | POA: Diagnosis not present

## 2023-10-19 DIAGNOSIS — J3089 Other allergic rhinitis: Secondary | ICD-10-CM | POA: Diagnosis not present

## 2023-10-19 DIAGNOSIS — F411 Generalized anxiety disorder: Secondary | ICD-10-CM | POA: Diagnosis not present

## 2023-10-19 DIAGNOSIS — K2100 Gastro-esophageal reflux disease with esophagitis, without bleeding: Secondary | ICD-10-CM | POA: Diagnosis not present

## 2023-10-19 DIAGNOSIS — L639 Alopecia areata, unspecified: Secondary | ICD-10-CM | POA: Diagnosis not present

## 2024-01-23 DIAGNOSIS — F259 Schizoaffective disorder, unspecified: Secondary | ICD-10-CM | POA: Diagnosis not present

## 2024-01-23 DIAGNOSIS — M5459 Other low back pain: Secondary | ICD-10-CM | POA: Diagnosis not present

## 2024-01-23 DIAGNOSIS — H938X2 Other specified disorders of left ear: Secondary | ICD-10-CM | POA: Diagnosis not present

## 2024-01-23 DIAGNOSIS — K2100 Gastro-esophageal reflux disease with esophagitis, without bleeding: Secondary | ICD-10-CM | POA: Diagnosis not present

## 2024-01-23 DIAGNOSIS — J3089 Other allergic rhinitis: Secondary | ICD-10-CM | POA: Diagnosis not present

## 2024-01-23 DIAGNOSIS — L639 Alopecia areata, unspecified: Secondary | ICD-10-CM | POA: Diagnosis not present

## 2024-01-23 DIAGNOSIS — N182 Chronic kidney disease, stage 2 (mild): Secondary | ICD-10-CM | POA: Diagnosis not present

## 2024-01-23 DIAGNOSIS — E782 Mixed hyperlipidemia: Secondary | ICD-10-CM | POA: Diagnosis not present

## 2024-01-23 DIAGNOSIS — F411 Generalized anxiety disorder: Secondary | ICD-10-CM | POA: Diagnosis not present

## 2024-02-06 DIAGNOSIS — H6122 Impacted cerumen, left ear: Secondary | ICD-10-CM | POA: Diagnosis not present

## 2024-02-06 DIAGNOSIS — H93292 Other abnormal auditory perceptions, left ear: Secondary | ICD-10-CM | POA: Diagnosis not present

## 2024-02-06 DIAGNOSIS — R220 Localized swelling, mass and lump, head: Secondary | ICD-10-CM | POA: Diagnosis not present

## 2024-04-23 DIAGNOSIS — F411 Generalized anxiety disorder: Secondary | ICD-10-CM | POA: Diagnosis not present

## 2024-04-23 DIAGNOSIS — L639 Alopecia areata, unspecified: Secondary | ICD-10-CM | POA: Diagnosis not present

## 2024-04-23 DIAGNOSIS — E782 Mixed hyperlipidemia: Secondary | ICD-10-CM | POA: Diagnosis not present

## 2024-04-23 DIAGNOSIS — F259 Schizoaffective disorder, unspecified: Secondary | ICD-10-CM | POA: Diagnosis not present

## 2024-04-23 DIAGNOSIS — K2100 Gastro-esophageal reflux disease with esophagitis, without bleeding: Secondary | ICD-10-CM | POA: Diagnosis not present

## 2024-04-23 DIAGNOSIS — J3089 Other allergic rhinitis: Secondary | ICD-10-CM | POA: Diagnosis not present

## 2024-04-23 DIAGNOSIS — M5459 Other low back pain: Secondary | ICD-10-CM | POA: Diagnosis not present

## 2024-04-23 DIAGNOSIS — N182 Chronic kidney disease, stage 2 (mild): Secondary | ICD-10-CM | POA: Diagnosis not present

## 2024-07-25 DIAGNOSIS — Z0000 Encounter for general adult medical examination without abnormal findings: Secondary | ICD-10-CM | POA: Diagnosis not present

## 2024-07-25 DIAGNOSIS — M25572 Pain in left ankle and joints of left foot: Secondary | ICD-10-CM | POA: Diagnosis not present

## 2024-07-25 DIAGNOSIS — L639 Alopecia areata, unspecified: Secondary | ICD-10-CM | POA: Diagnosis not present

## 2024-07-25 DIAGNOSIS — N182 Chronic kidney disease, stage 2 (mild): Secondary | ICD-10-CM | POA: Diagnosis not present

## 2024-07-25 DIAGNOSIS — F411 Generalized anxiety disorder: Secondary | ICD-10-CM | POA: Diagnosis not present

## 2024-07-25 DIAGNOSIS — E782 Mixed hyperlipidemia: Secondary | ICD-10-CM | POA: Diagnosis not present

## 2024-07-25 DIAGNOSIS — M5459 Other low back pain: Secondary | ICD-10-CM | POA: Diagnosis not present

## 2024-07-25 DIAGNOSIS — F259 Schizoaffective disorder, unspecified: Secondary | ICD-10-CM | POA: Diagnosis not present

## 2024-07-25 DIAGNOSIS — J3089 Other allergic rhinitis: Secondary | ICD-10-CM | POA: Diagnosis not present

## 2024-07-25 DIAGNOSIS — Z1331 Encounter for screening for depression: Secondary | ICD-10-CM | POA: Diagnosis not present

## 2024-07-25 DIAGNOSIS — K2100 Gastro-esophageal reflux disease with esophagitis, without bleeding: Secondary | ICD-10-CM | POA: Diagnosis not present
